# Patient Record
Sex: Male | Born: 1995 | Race: Black or African American | Hispanic: No | Marital: Single | Smoking: Never smoker
Health system: Southern US, Community
[De-identification: ages and names within clinical notes are randomized; demographics above are authoritative.]

## PROBLEM LIST (undated history)

## (undated) DIAGNOSIS — F419 Anxiety disorder, unspecified: Secondary | ICD-10-CM

## (undated) DIAGNOSIS — Z973 Presence of spectacles and contact lenses: Secondary | ICD-10-CM

## (undated) DIAGNOSIS — F909 Attention-deficit hyperactivity disorder, unspecified type: Secondary | ICD-10-CM

## (undated) DIAGNOSIS — T7840XA Allergy, unspecified, initial encounter: Secondary | ICD-10-CM

## (undated) DIAGNOSIS — F329 Major depressive disorder, single episode, unspecified: Secondary | ICD-10-CM

## (undated) DIAGNOSIS — F32A Depression, unspecified: Secondary | ICD-10-CM

## (undated) DIAGNOSIS — F845 Asperger's syndrome: Secondary | ICD-10-CM

## (undated) DIAGNOSIS — S36039A Unspecified laceration of spleen, initial encounter: Secondary | ICD-10-CM

## (undated) DIAGNOSIS — F332 Major depressive disorder, recurrent severe without psychotic features: Secondary | ICD-10-CM

## (undated) DIAGNOSIS — E669 Obesity, unspecified: Secondary | ICD-10-CM

## (undated) DIAGNOSIS — T1491XA Suicide attempt, initial encounter: Secondary | ICD-10-CM

## (undated) DIAGNOSIS — J329 Chronic sinusitis, unspecified: Secondary | ICD-10-CM

## (undated) HISTORY — DX: Obesity, unspecified: E66.9

## (undated) HISTORY — DX: Suicide attempt, initial encounter: T14.91XA

## (undated) HISTORY — DX: Allergy, unspecified, initial encounter: T78.40XA

## (undated) HISTORY — DX: Presence of spectacles and contact lenses: Z97.3

## (undated) HISTORY — PX: FRENULECTOMY, LINGUAL: SHX1681

## (undated) HISTORY — DX: Asperger's syndrome: F84.5

## (undated) HISTORY — DX: Unspecified laceration of spleen, initial encounter: S36.039A

---
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
## 2017-07-07 ENCOUNTER — Encounter: Payer: Self-pay | Admitting: Medical

## 2017-07-07 ENCOUNTER — Ambulatory Visit (INDEPENDENT_AMBULATORY_CARE_PROVIDER_SITE_OTHER): Payer: Medicaid Other | Admitting: Medical

## 2017-07-07 VITALS — BP 122/76 | HR 80 | Ht 72.0 in | Wt 256.2 lb

## 2017-07-07 DIAGNOSIS — Z0000 Encounter for general adult medical examination without abnormal findings: Secondary | ICD-10-CM | POA: Diagnosis not present

## 2017-07-07 DIAGNOSIS — Z23 Encounter for immunization: Secondary | ICD-10-CM

## 2017-07-07 DIAGNOSIS — F901 Attention-deficit hyperactivity disorder, predominantly hyperactive type: Secondary | ICD-10-CM | POA: Diagnosis not present

## 2017-07-07 DIAGNOSIS — Z79899 Other long term (current) drug therapy: Secondary | ICD-10-CM

## 2017-07-07 DIAGNOSIS — F840 Autistic disorder: Secondary | ICD-10-CM

## 2017-07-07 DIAGNOSIS — E669 Obesity, unspecified: Secondary | ICD-10-CM | POA: Diagnosis not present

## 2017-07-07 DIAGNOSIS — F332 Major depressive disorder, recurrent severe without psychotic features: Secondary | ICD-10-CM | POA: Diagnosis not present

## 2017-07-07 DIAGNOSIS — J301 Allergic rhinitis due to pollen: Secondary | ICD-10-CM

## 2017-07-07 NOTE — Progress Notes (Signed)
Subjective: Chief Complaint  Patient presents with  . Annual Exam    physical , no other concerns    Medical Team: Dr. Thedore MinsMojeed Akintayo, psychiatry New therapist, [facility]ton [facility]  Dr. Salvadore Farberanya Redd, dentist [facility] Tysinger, Kermit Baloavid S, PA-C here for primary care  Accompanied by mother most of the visit.  I did reserve time in the visit to speak with him alone but he had no private concerns today.  Most of the history today provided by mother and some by patient.  Randall Thompson is a 22 year old African-American male with significant mental health issues, prior suicidal attempt, major depression.  He has been with the last therapist for a year and a half, but that therapist just relocated so he is having to establish with a new therapist at this time.  He still sees Dr. Delories HeinzAckintayo every 2 months.  He currently lives at home with mother, has 2 sisters at home that he sometimes gets along with.  He reports today that all he does currently is sleep, has no interest in anything, no hobbies, not much interaction in the community.  Mom just recently had him set up with a job coach to explore some ideas.  1 of their goals is eventually getting him to the point of some independence.  He is receiving respite services through the [employer].  He was recently approved for [employer] transportation services.  Therapist recommends he get active. Recently got a dog that he can have responsibility for and walk the dog for exercise.   .    Reviewed their medical, surgical, family, social, medication, and allergy history and updated chart as appropriate.  Past Medical History:  Diagnosis Date  . ADHD (attention deficit hyperactivity disorder)   . Allergy   . Anxiety   . Asperger's disorder    evaluation by psychiatry and neurology 2002-2008 (teach program, therapy, psychiatry)  . Depression    sees a therapist and [facility]  . MDD (major depressive disorder),  recurrent severe, without psychosis (HCC) 03/03/2015  . Obesity   . Sinusitis   . Spleen laceration 2015  . Suicide attempt [facility](HCC)    several prior attempts as of 12/2015; hospitalization 10/2015 for suicide attempt  . Wears glasses     Past Surgical History:  Procedure Laterality Date  . FRENULECTOMY, LINGUAL      Social History   Socioeconomic History  . Marital status: Single    Spouse name: Not on file  . Number of children: Not on file  . Years of education: Not on file  . Highest education level: Not on file  Social Needs  . Financial resource strain: Not on file  . Food insecurity - worry: Not on file  . Food insecurity - inability: Not on file  . Transportation needs - medical: Not on file  . Transportation needs - non-medical: Not on file  Occupational History  . Not on file  Tobacco Use  . Smoking status: Never Smoker  . Smokeless tobacco: Never Used  Substance and Sexual Activity  . Alcohol use: No  . Drug use: No  . Sexual activity: No    Birth control/protection: Abstinence  Other Topics Concern  . Not on file  Social History Narrative   Lives at home with mother and 2 younger sisters, ages 7615 and 213.   [place] to [employer].  Plans to finish high school equivalents end of 2017.  Has 504 plan.  He states that he is not involved in any extra-curricular activities.  Sees father some.   There are no pets in the home and patient is not exposed to smoking.  Interested in computers, plays game on phone some.   Reads some.  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 Hypertension Father   . Depression Mother   . Fibromyalgia Mother   . Hypertension Mother   . ADD / ADHD Sister   . Depression Maternal Grandmother   . Hypertension Maternal Grandmother   . Hyperlipidemia Maternal Grandmother   . Heart disease Maternal Grandmother        long QT  . Hypertension Paternal  Grandmother   . Diabetes Paternal Grandmother   . Hypertension Paternal Grandfather   . Heart disease Paternal Grandfather      Current Outpatient Medications:  .  FLUoxetine (PROZAC) 40 MG capsule, Take 1 capsule (40 mg total) by mouth daily., Disp: 30 capsule, Rfl: 2 .  ibuprofen (ADVIL,MOTRIN) 200 MG tablet, Take 600 mg by mouth every 6 (six) hours as needed for headache or mild pain. Reported on 12/18/2015, Disp: , Rfl:  .  lurasidone (LATUDA) 40 MG TABS tablet, Take 1 tablet (40 mg total) by mouth daily with breakfast. (Patient taking differently: Take 40 mg by mouth at bedtime. ), Disp: 30 tablet, Rfl: 1 .  montelukast (SINGULAIR) 10 MG tablet, Take 1 tablet (10 mg total) by mouth at bedtime., Disp: 90 tablet, Rfl: 3 .  hydrOXYzine (ATARAX/VISTARIL) 25 MG tablet, TAKE 1 TWICE DAILY FOR ANXIETY/AGITATION, Disp: , Rfl: 1 .  zolpidem (AMBIEN) 10 MG tablet, Take 1 tablet (10 mg total) by mouth at bedtime as needed for sleep., Disp: 30 tablet, Rfl: 0  Allergies  Allergen Reactions  . Latex Swelling       Review of Systems Constitutional: -fever, -chills, -sweats, -unexpected weight change, -decreased appetite, -fatigue Allergy: -sneezing, -itching, -congestion Dermatology: -changing moles, --rash, -lumps ENT: -runny nose, -ear pain, -sore throat, -hoarseness, -sinus pain, -teeth pain, - ringing in ears, -hearing loss, -nosebleeds Cardiology: -chest pain, -palpitations, -swelling, -difficulty breathing when lying flat, -waking up short of breath Respiratory: -cough, -shortness of breath, -difficulty breathing with exercise or exertion, -wheezing, -coughing up blood Gastroenterology: -abdominal pain, -nausea, -vomiting, -diarrhea, -constipation, -blood in stool, -changes in bowel movement, -difficulty swallowing or eating Hematology: -bleeding, -bruising  Musculoskeletal: -joint aches, -muscle aches, -joint swelling, -back pain, -neck pain, -cramping, -changes in gait Ophthalmology:  denies vision changes, eye redness, itching, discharge Urology: -burning with urination, -difficulty urinating, -blood in urine, -urinary frequency, -urgency, -incontinence Neurology: -headache, -weakness, -tingling, -numbness, -memory loss, -falls, -dizziness Psychology: -depressed mood, -agitation, -sleep problems     Objective:   BP 122/76   Pulse 80   Ht 6' (1.829 m)   Wt 256 lb 3.2 oz (116.2 kg)   SpO2 98%   BMI 34.75 kg/m   General appearance: alert, no distress, WD/WN, African American male Skin: no worrisome lesions HEENT: normocephalic, conjunctiva/corneas normal, sclerae anicteric, PERRLA, EOMi, nares patent, no discharge or erythema, pharynx normal Oral cavity: MMM, tongue normal, teeth normal Neck: supple, no lymphadenopathy, no thyromegaly, no masses, normal ROM, no bruits Chest: non tender, normal shape and expansion Heart: RRR, normal S1, S2, no murmurs Lungs: CTA bilaterally, no wheezes, rhonchi, or rales Abdomen: +bs, soft, non tender, non distended, no masses, no hepatomegaly, no splenomegaly, no  bruits Back: non tender, normal ROM, no scoliosis Musculoskeletal: upper extremities non tender, no obvious deformity, normal ROM throughout, lower extremities non tender, no obvious deformity, normal ROM throughout Extremities: no edema, no cyanosis, no clubbing Pulses: 2+ symmetric, upper and lower extremities, normal cap refill Neurological: alert, oriented x 3, CN2-12 intact, strength normal upper extremities and lower extremities, sensation normal throughout, DTRs 2+ throughout, no cerebellar signs, gait normal Psychiatric: flat affect, pleasant , but no signifianct conversation or interaction ZO:[id number] today Rectal: deferred   Assessment and Plan :    Encounter Diagnoses  Name Primary?  . Encounter for health maintenance examination in adult Yes  . Need for influenza vaccination   . Autistic spectrum disorder   . Allergic rhinitis due to pollen, unspecified  seasonality   . High risk medication use   . MDD (major depressive disorder), recurrent severe, without psychosis (HCC)   . Obesity with serious comorbidity, unspecified classification, unspecified obesity type   . Attention deficit hyperactivity disorder (ADHD), predominantly hyperactive type     Physical exam - discussed and counseled on healthy lifestyle, diet, exercise, preventative care, vaccinations, sick and well care, proper use of emergency dept and after hours care, and addressed their concerns.  Routine labs today  See your eye doctor yearly for routine vision care. See your dentist yearly for routine dental care including hygiene visits twice yearly.  Vaccinations: Counseled on the influenza virus vaccine.  Vaccine information sheet given.  Influenza vaccine given after consent obtained.  Separate significant chronic issues discussed: We spent some time discussing his significant mental health issues.  I do not have access to his mental health records and I have not had conversations with his psychiatrist or counselor.  He does not seem to be in a position to likely hold a job in the normal setting, and not likely to be able to function independently.  I did recommend mother look into day programs, consider group home as a place of residence and community interaction in the future.  He certainly has had significant mental health issues including prior suicide attempt, and it would likely be very difficult for him to remain at home for years at his current status.   I will try and get mom a list of some of the resources in the community, and he will certainly follow-up with psychiatry and therapist.  Jerardo was seen today for annual exam.  Diagnoses and all orders for this visit:  Encounter for health maintenance examination in adult -     CBC -     Comprehensive metabolic panel -     Lipid panel -     TSH -     Hemoglobin A1c  Need for influenza vaccination -     Flu Vaccine  QUAD 6+ mos PF IM (Fluarix Quad PF)  Autistic spectrum disorder  Allergic rhinitis due to pollen, unspecified seasonality  High risk medication use  MDD (major depressive disorder), recurrent severe, without psychosis (HCC)  Obesity with serious comorbidity, unspecified classification, unspecified obesity type  Attention deficit hyperactivity disorder (ADHD), predominantly hyperactive type    Follow-up pending labs, yearly for physical

## 2017-07-08 ENCOUNTER — Encounter: Payer: Self-pay | Admitting: Medical

## 2017-07-08 LAB — HEMOGLOBIN A1C
Est. average glucose Bld gHb Est-mCnc: 94 mg/dL
Hgb A1c MFr Bld: 4.9 % (ref 4.8–5.6)

## 2017-07-08 LAB — CBC
HEMOGLOBIN: 13.5 g/dL (ref 13.0–17.7)
Hematocrit: 41.5 % (ref 37.5–51.0)
MCH: 23.3 pg — ABNORMAL LOW (ref 26.6–33.0)
MCHC: 32.5 g/dL (ref 31.5–35.7)
MCV: 72 fL — ABNORMAL LOW (ref 79–97)
Platelets: 309 10*3/uL (ref 150–379)
RBC: 5.8 x10E6/uL (ref 4.14–5.80)
RDW: 14.9 % (ref 12.3–15.4)
WBC: 5.7 10*3/uL (ref 3.4–10.8)

## 2017-07-08 LAB — LIPID PANEL
Chol/HDL Ratio: 3.7 ratio (ref 0.0–5.0)
Cholesterol, Total: 130 mg/dL (ref 100–169)
HDL: 35 mg/dL — AB (ref >39)
LDL CALC: 75 mg/dL (ref 0–109)
Triglycerides: 98 mg/dL — ABNORMAL HIGH (ref 0–89)
VLDL CHOLESTEROL CAL: 20 mg/dL (ref 5–40)

## 2017-07-08 LAB — COMPREHENSIVE METABOLIC PANEL
ALBUMIN: 4.6 g/dL (ref 3.5–5.5)
ALT: 26 IU/L (ref 0–44)
AST: 19 IU/L (ref 0–40)
Albumin/Globulin Ratio: 1.8 (ref 1.2–2.2)
Alkaline Phosphatase: 70 IU/L (ref 39–117)
BUN / CREAT RATIO: 12 (ref 9–20)
BUN: 14 mg/dL (ref 6–20)
Bilirubin Total: 0.5 mg/dL (ref 0.0–1.2)
CALCIUM: 9.3 mg/dL (ref 8.7–10.2)
CO2: 25 mmol/L (ref 20–29)
CREATININE: 1.16 mg/dL (ref 0.76–1.27)
Chloride: 102 mmol/L (ref 96–106)
GFR calc non Af Amer: 91 mL/min/{1.73_m2} (ref >59)
GFR, EST AFRICAN AMERICAN: 105 mL/min/{1.73_m2} (ref >59)
GLUCOSE: 104 mg/dL — AB (ref 65–99)
Globulin, Total: 2.6 g/dL (ref 1.5–4.5)
Potassium: 4.6 mmol/L (ref 3.5–5.2)
Sodium: 141 mmol/L (ref 134–144)
TOTAL PROTEIN: 7.2 g/dL (ref 6.0–8.5)

## 2017-07-08 LAB — TSH: TSH: 0.458 u[IU]/mL (ref 0.450–4.500)

## 2018-02-21 ENCOUNTER — Encounter: Payer: Self-pay | Admitting: Medical

## 2018-02-21 ENCOUNTER — Ambulatory Visit (INDEPENDENT_AMBULATORY_CARE_PROVIDER_SITE_OTHER): Payer: Medicaid Other | Admitting: Medical

## 2018-02-21 VITALS — BP 124/80 | HR 80 | Temp 98.4°F | Resp 16 | Ht 73.0 in | Wt 268.6 lb

## 2018-02-21 DIAGNOSIS — J988 Other specified respiratory disorders: Secondary | ICD-10-CM

## 2018-02-21 MED ORDER — AZITHROMYCIN 250 MG PO TABS: ORAL_TABLET | ORAL | 0 refills | Status: DC

## 2018-02-21 NOTE — Progress Notes (Signed)
Subjective: Chief Complaint  Patient presents with  . cold    cough, headache, congestion X 1 week   Here for headache, cough, congestion x 1 week.   No fever, no NVD.  Has had sore throat, but no ear pain.   Using Mucinex extra strength.   Good about water intake.  Has had sick contacts. No other aggravating or relieving factors. No other complaint.  Past Medical History:  Diagnosis Date  . ADHD (attention deficit hyperactivity disorder)   . Allergy   . Anxiety   . Asperger's disorder    evaluation by psychiatry and neurology 2002-2008 (teach program, therapy, psychiatry)  . Depression    sees a therapist and [facility]  . 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 Obesity   . Sinusitis   . Spleen laceration 2015  . Suicide attempt [facility](HCC)    several prior attempts as of 12/2015; hospitalization 10/2015 for suicide attempt  . Wears glasses    Current Outpatient Medications on File Prior to Visit  Medication Sig Dispense Refill  . FLUoxetine (PROZAC) 40 MG capsule Take 1 capsule (40 mg total) by mouth daily. 30 capsule 2  . hydrOXYzine (ATARAX/VISTARIL) 25 MG tablet TAKE 1 TWICE DAILY FOR ANXIETY/AGITATION  1  . ibuprofen (ADVIL,MOTRIN) 200 MG tablet Take 600 mg by mouth every 6 (six) hours as needed for headache or mild pain. Reported on 12/18/2015    . LATUDA 80 MG TABS tablet Take 80 mg by mouth 1 day or 1 dose.  2  . montelukast (SINGULAIR) 10 MG tablet Take 1 tablet (10 mg total) by mouth at bedtime. 90 tablet 3  . traZODone (DESYREL) 50 MG tablet Take 50 mg by mouth as needed.  2   No current facility-administered medications on file prior to visit.    ROS as in subjective   Objective: BP 124/80   Pulse 80   Temp 98.4 F (36.9 C) (Oral)   Resp 16   Ht 6\' 1"  (1.854 m)   Wt 268 lb 9.6 oz (121.8 kg)   SpO2 96%   BMI 35.44 kg/m    General appearance: alert, no distress, WD/WN,  HEENT:  normocephalic, sclerae anicteric, TMs pearly, nares with mild turbinate edema and mild mucoid discharge, mild erythema, pharynx normal Oral cavity: MMM, no lesions Neck: supple, no lymphadenopathy, no thyromegaly, no masses Heart: RRR, normal S1, S2, no murmurs Lungs: CTA bilaterally, no wheezes, rhonchi, or rales    Assessment Encounter Diagnosis  Name Primary?  Marland Kitchen. Respiratory tract infection Yes     Plan: symptoms and exam suggest viral URI.  Continue mucinex, needs to increase water intake, and gave watch and wait antibiotic in the even no improvement or worse in the next 48+ hours.  Currently exam unremarkable.  Call or return if worse or not improving.   Randall Thompson was seen today for cold.  Diagnoses and all orders for this visit:  Respiratory tract infection  Other orders -     azithromycin (ZITHROMAX) 250 MG tablet; 2 tablets day 1, then 1 tablet days 2-4

## 2019-01-26 ENCOUNTER — Other Ambulatory Visit: Payer: Self-pay

## 2019-01-26 ENCOUNTER — Encounter: Payer: Self-pay | Admitting: Medical

## 2019-01-26 ENCOUNTER — Ambulatory Visit: Payer: Medicaid Other | Admitting: Medical

## 2019-01-26 VITALS — BP 110/72 | HR 83 | Temp 97.1°F | Ht 73.0 in | Wt 287.4 lb

## 2019-01-26 DIAGNOSIS — Z79899 Other long term (current) drug therapy: Secondary | ICD-10-CM

## 2019-01-26 DIAGNOSIS — K036 Deposits [accretions] on teeth: Secondary | ICD-10-CM

## 2019-01-26 DIAGNOSIS — Z23 Encounter for immunization: Secondary | ICD-10-CM | POA: Diagnosis not present

## 2019-01-26 DIAGNOSIS — F840 Autistic disorder: Secondary | ICD-10-CM

## 2019-01-26 DIAGNOSIS — Z0000 Encounter for general adult medical examination without abnormal findings: Secondary | ICD-10-CM

## 2019-01-26 DIAGNOSIS — F332 Major depressive disorder, recurrent severe without psychotic features: Secondary | ICD-10-CM

## 2019-01-26 DIAGNOSIS — E669 Obesity, unspecified: Secondary | ICD-10-CM

## 2019-01-26 NOTE — Patient Instructions (Signed)
We updated your Tetanus diptheria and flu shot today  We will call with lab results  You have a lot of dental plaque today.   Brush your teeth twice daily.  Floss every day as well.  Go see the dentist soon for hygeine visit  See your eye doctor yearly.   I would try to limit video games to 1 hour at a time.   Get outside more, go for a walk or bike ride.   Read as much or more than you are on the video games   I recommend making some changes with eating habits and exercise as below.   Exercise: I recommend exercising most days of the week using a type of exercise that you would enjoy and stick to such as walking, running, swimming, hiking, biking, aerobics, etc. This needs to be at least 30-40 minutes at a time, at least 5 days/week with moderate intensity.    Low Carb Diet recommendations:  I recommend you drink water throughout the day.   70 ounces or 2 liters would be a good amount.  If you have been accustomed to drinking juice or soda, try water with lemon or water with lime, or try using no calorie flavor dropper.  I recommend the following as an example meal plan that includes 3 meals per day.   You can skip some meals periodically for intermittent fasting.  Breakfast (choose one): Marland Kitchen Omelette with egg, can include a little bit of cheese, your choice of mushrooms, peppers, onions, salsa . Smoothie with handful of spinach or kale, 1 cup of milk or water, 1 cup of berries, 1 packet of artificial sweetener such as stevia . Yogurt with fruit   Mid morning snack: . 1 fruit serving and 1 protein such as 8 almonds or 8 nuts, or vegetable such as carrots and humus or other similar vegetable   Lunch: . Salad with 3-4 ounces of lean grilled or baked meat such as fish, chicken or [place]   Mid afternoon snack: . 1 fruit serving and 1 protein such as 8 almonds or 8 nuts, or vegetable such as carrots and humus or other similar vegetable   Dinner: . Large serving of vegetables and  3-4 ounces of lean grilled or baked meat such as fish, chicken or [place] . Or vegetarian dish without meat    Avoid  . Chips, cookies, cake, donuts, soda, sweet tea, juices, candy, fast food . For now , avoid, or significantly limit grains

## 2019-01-26 NOTE — Progress Notes (Signed)
Subjective:   HPI  Randall Caohomas H Kapral Jr. is a 23 y.o. male who presents for Chief Complaint  Patient presents with  . Annual Exam    Medical team Sees dentist Sees eye doctor Dr. Thedore MinsMojeed Akintayo, psychiatry and Leone Payorrystal Montague, [facility] Dimitri Shakespeare, Kermit Baloavid S, PA-C here for primary care  Concerns: Here today unaccompanied in the room however mom did not get on the phone during part of the call.  He has a history of Asperger's, autism spectrum disorder, major depression disorder, other mental health issues.  Sees psychiatry monthly.  Recently had some medication changes was put on injection to reduce some of his pill burden.  He lives at home with mother.  He says he is searching for work (however I think this is what he told me last time I saw him about a year ago).  He spends he says half the day playing video games, spends sometimes reading.  He denies doing any exercise.  Overall he denies any recent problems  Mother says they continue to have problems with medications, problems with mood up and down  No other complaint  Reviewed their medical, surgical, family, social, medication, and allergy history and updated chart as appropriate.  Past Medical History:  Diagnosis Date  . ADHD (attention deficit hyperactivity disorder)   . Allergy   . Anxiety   . Asperger's disorder    evaluation by psychiatry and neurology 2002-2008 (teach program, therapy, psychiatry)  . Depression    sees a therapist and sees [facility] Luc[facility]umar[facility]  . 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 Obesity   . Sinusitis   . Spleen laceration 2015  . Suicide attempt [facility](HCC)    several prior attempts as of 12/2015; hospitalization 10/2015 for suicide attempt  . Wears glasses     Past Surgical History:  Procedure Laterality Date  . FRENULECTOMY, LINGUAL      Social History   Socioeconomic History  . Marital status:  Single    Spouse name: Not on file  . Number of children: Not on file  . Years of education: Not on file  . Highest education level: Not on file  Occupational History  . Not on file  Social Needs  . Financial resource strain: Not on file  . Food insecurity    Worry: Not on file    Inability: Not on file  . Transportation needs    Medical: Not on file    Non-medical: Not on file  Tobacco Use  . Smoking status: Never Smoker  . Smokeless tobacco: Never Used  Substance and Sexual Activity  . Alcohol use: No  . Drug use: No  . Sexual activity: Never    Birth control/protection: Abstinence  Lifestyle  . Physical activity    Days per week: Not on file    Minutes per session: Not on file  . Stress: Not on file  Relationships  . Social Musicianconnections    Talks on phone: Not on file    Gets together: Not on file    Attends religious service: Not on file    Active member of club or organization: Not on file    Attends meetings of clubs or organizations: Not on file    Relationship status: Not on file  . Intimate partner violence    Fear of current or ex partner: Not on file    Emotionally abused: Not on file    Physically abused:  Not on file    Forced sexual activity: Not on file  Other Topics Concern  . Not on file  Social History Narrative   Lives at home with mother.  Went to [employer] high school.  Spends a lot of time playing video games.  Sees father some.   01/2019    Family History  Problem Relation Age of Onset  . Hypertension Father   . Depression Mother   . Fibromyalgia Mother   . Hypertension Mother   . ADD / ADHD Sister   . Depression Maternal Grandmother   . Hypertension Maternal Grandmother   . Hyperlipidemia Maternal Grandmother   . Heart disease Maternal Grandmother        long QT  . Hypertension Paternal Grandmother   . Diabetes Paternal Grandmother   . Hypertension Paternal Grandfather   . Heart disease Paternal Grandfather      Current  Outpatient Medications:  .  FLUoxetine (PROZAC) 40 MG capsule, Take 1 capsule (40 mg total) by mouth daily., Disp: 30 capsule, Rfl: 2 .  ibuprofen (ADVIL,MOTRIN) 200 MG tablet, Take 600 mg by mouth every 6 (six) hours as needed for headache or mild pain. Reported on 12/18/2015, Disp: , Rfl:  .  montelukast (SINGULAIR) 10 MG tablet, Take 1 tablet (10 mg total) by mouth at bedtime., Disp: 90 tablet, Rfl: 3 .  traZODone (DESYREL) 50 MG tablet, Take 50 mg by mouth as needed., Disp: , Rfl: 2 .  azithromycin (ZITHROMAX) 250 MG tablet, 2 tablets day 1, then 1 tablet days 2-4 (Patient not taking: Reported on 01/26/2019), Disp: 6 tablet, Rfl: 0 .  hydrOXYzine (ATARAX/VISTARIL) 25 MG tablet, TAKE 1 TWICE DAILY FOR ANXIETY/AGITATION, Disp: , Rfl: 1 .  LATUDA 80 MG TABS tablet, Take 80 mg by mouth 1 day or 1 dose., Disp: , Rfl: 2  Allergies  Allergen Reactions  . Latex Swelling    Review of Systems Constitutional: -fever, -chills, -sweats, -unexpected weight change, -decreased appetite, -fatigue Allergy: -sneezing, -itching, -congestion Dermatology: -changing moles, --rash, -lumps ENT: -runny nose, -ear pain, -sore throat, -hoarseness, -sinus pain, -teeth pain, - ringing in ears, -hearing loss, -nosebleeds Cardiology: -chest pain, -palpitations, -swelling, -difficulty breathing when lying flat, -waking up short of breath Respiratory: -cough, -shortness of breath, -difficulty breathing with exercise or exertion, -wheezing, -coughing up blood Gastroenterology: -abdominal pain, -nausea, -vomiting, -diarrhea, -constipation, -blood in stool, -changes in bowel movement, -difficulty swallowing or eating Hematology: -bleeding, -bruising  Musculoskeletal: -joint aches, -muscle aches, -joint swelling, -back pain, -neck pain, -cramping, -changes in gait Ophthalmology: denies vision changes, eye redness, itching, discharge Urology: -burning with urination, -difficulty urinating, -blood in urine, -urinary frequency,  -urgency, -incontinence Neurology: -headache, -weakness, -tingling, -numbness, -memory loss, -falls, -dizziness Psychology: +depressed mood, -agitation, -sleep problems Male GU: no testicular mass, pain, no lymph nodes swollen, no swelling, no rash.     Objective:  BP 110/72 (BP Location: Left Arm, Patient Position: Sitting)   Pulse 83   Temp (!) 97.1 F (36.2 C)   Ht 6\' 1"  (1.854 m)   Wt 287 lb 6.4 oz (130.4 kg)   SpO2 98%   BMI 37.92 kg/m   General appearance: alert, no distress, WD/WN, African American male Skin: no worrisome lesions HEENT: normocephalic, conjunctiva/corneas normal, sclerae anicteric, PERRLA, EOMi, nares patent, no discharge or erythema, pharynx normal Oral cavity: MMM, tongue normal, moderate plaque Neck: supple, no lymphadenopathy, no thyromegaly, no masses, normal ROM, no bruits Chest: non tender, normal shape and expansion Heart: RRR, normal S1,  S2, no murmurs Lungs: CTA bilaterally, no wheezes, rhonchi, or rales Abdomen: +bs, soft, non tender, non distended, no masses, no hepatomegaly, no splenomegaly, no bruits Back: non tender, normal ROM, no scoliosis Musculoskeletal: upper extremities non tender, no obvious deformity, normal ROM throughout, lower extremities non tender, no obvious deformity, normal ROM throughout Extremities: no edema, no cyanosis, no clubbing Pulses: 2+ symmetric, upper and lower extremities, normal cap refill Neurological: alert, oriented x 3, CN2-12 intact, strength normal upper extremities and lower extremities, sensation normal throughout, DTRs 2+ throughout, no cerebellar signs, gait normal Psychiatric: normal affect, behavior normal, pleasant  GU/rectal - deferred   Assessment and Plan :   Encounter Diagnoses  Name Primary?  . Encounter for health maintenance examination in adult Yes  . Need for influenza vaccination   . Need for Td vaccine   . High risk medication use   . Obesity with serious comorbidity, unspecified  classification, unspecified obesity type   . Dental plaque   . Autistic spectrum disorder   . MDD (major depressive disorder), recurrent severe, without psychosis (HCC)     Physical exam - discussed and counseled on healthy lifestyle, diet, exercise, preventative care, vaccinations, sick and well care, proper use of emergency dept and after hours care, and addressed their concerns.    Health screening: See your eye doctor yearly for routine vision care. See your dentist yearly for routine dental care including hygiene visits twice yearly.  Cancer screening Advised monthly self testicular exam  Vaccinations: Counseled on the influenza virus vaccine.  Vaccine information sheet given.  Influenza vaccine given after consent obtained.  Counseled on the Td (tetanus, diptheria) vaccine.  Vaccine information sheet given. Td vaccine given after consent obtained.  Separate significant chronic issues discussed: We spent some considerable time talking about diet, exercise, need to lose weight, need to spend less time on video games, and more time with reading and getting some exercise and outside time  Dental plaque-discussed importance of brushing daily, flossing daily and needs to see dental hygienist soon  Mental health issues, autism spectrum disorder, depression, history of suicide attempt-followed by psychiatry regularly  Randall Thompson was seen today for annual exam.  Diagnoses and all orders for this visit:  Encounter for health maintenance examination in adult -     Comprehensive metabolic panel -     CBC with Differential/Platelet -     Lipid panel -     Hemoglobin A1c  Need for influenza vaccination -     Flu Vaccine QUAD 36+ mos IM  Need for Td vaccine -     Td : Tetanus/diphtheria >7yo Preservative  free  High risk medication use  Obesity with serious comorbidity, unspecified classification, unspecified obesity type  Dental plaque  Autistic spectrum disorder  MDD (major  depressive disorder), recurrent severe, without psychosis (HCC)    Follow-up pending labs, yearly for physical

## 2019-01-27 LAB — CBC WITH DIFFERENTIAL/PLATELET
Basophils Absolute: 0 10*3/uL (ref 0.0–0.2)
Basos: 0 %
EOS (ABSOLUTE): 0.1 10*3/uL (ref 0.0–0.4)
Eos: 2 %
Hematocrit: 39.3 % (ref 37.5–51.0)
Hemoglobin: 12.5 g/dL — ABNORMAL LOW (ref 13.0–17.7)
Immature Grans (Abs): 0 10*3/uL (ref 0.0–0.1)
Immature Granulocytes: 0 %
Lymphocytes Absolute: 1.7 10*3/uL (ref 0.7–3.1)
Lymphs: 26 %
MCH: 23.6 pg — ABNORMAL LOW (ref 26.6–33.0)
MCHC: 31.8 g/dL (ref 31.5–35.7)
MCV: 74 fL — ABNORMAL LOW (ref 79–97)
Monocytes Absolute: 0.4 10*3/uL (ref 0.1–0.9)
Monocytes: 6 %
Neutrophils Absolute: 4.4 10*3/uL (ref 1.4–7.0)
Neutrophils: 66 %
Platelets: 285 10*3/uL (ref 150–450)
RBC: 5.3 x10E6/uL (ref 4.14–5.80)
RDW: 14.9 % (ref 11.6–15.4)
WBC: 6.6 10*3/uL (ref 3.4–10.8)

## 2019-01-27 LAB — COMPREHENSIVE METABOLIC PANEL
ALT: 28 IU/L (ref 0–44)
AST: 21 IU/L (ref 0–40)
Albumin/Globulin Ratio: 1.7 (ref 1.2–2.2)
Albumin: 4.6 g/dL (ref 4.1–5.2)
Alkaline Phosphatase: 61 IU/L (ref 39–117)
BUN/Creatinine Ratio: 13 (ref 9–20)
BUN: 15 mg/dL (ref 6–20)
Bilirubin Total: 0.7 mg/dL (ref 0.0–1.2)
CO2: 23 mmol/L (ref 20–29)
Calcium: 9.7 mg/dL (ref 8.7–10.2)
Chloride: 100 mmol/L (ref 96–106)
Creatinine, Ser: 1.18 mg/dL (ref 0.76–1.27)
GFR calc Af Amer: 101 mL/min/{1.73_m2} (ref >59)
GFR calc non Af Amer: 88 mL/min/{1.73_m2} (ref >59)
Globulin, Total: 2.7 g/dL (ref 1.5–4.5)
Glucose: 78 mg/dL (ref 65–99)
Potassium: 4.6 mmol/L (ref 3.5–5.2)
Sodium: 139 mmol/L (ref 134–144)
Total Protein: 7.3 g/dL (ref 6.0–8.5)

## 2019-01-27 LAB — LIPID PANEL
Chol/HDL Ratio: 3.2 ratio (ref 0.0–5.0)
Cholesterol, Total: 134 mg/dL (ref 100–199)
HDL: 42 mg/dL (ref >39)
LDL Calculated: 76 mg/dL (ref 0–99)
Triglycerides: 79 mg/dL (ref 0–149)
VLDL Cholesterol Cal: 16 mg/dL (ref 5–40)

## 2019-01-27 LAB — HEMOGLOBIN A1C
Est. average glucose Bld gHb Est-mCnc: 97 mg/dL
Hgb A1c MFr Bld: 5 % (ref 4.8–5.6)

## 2019-08-18 ENCOUNTER — Ambulatory Visit: Payer: Medicaid Other | Attending: Internal Medicine

## 2019-08-18 DIAGNOSIS — Z23 Encounter for immunization: Secondary | ICD-10-CM

## 2019-08-18 NOTE — Progress Notes (Signed)
   [id number]  Name:  Coyle Stordahl.    MRN: [id number] DOB: 01/25/1996  08/18/2019  Mr. Capaldi was observed post Covid-19 immunization for 15 minutes without incident. He was provided with Vaccine Information Sheet and instruction to access the V-Safe system.   Mr. Lamere was instructed to call 911 with any severe reactions post vaccine: Marland Kitchen Difficulty breathing  . Swelling of face and throat  . A fast heartbeat  . A bad rash all over body  . Dizziness and weakness   Immunizations Administered    Name Date Dose VIS Date Route   Pfizer COVID-19 Vaccine 08/18/2019  8:27 AM 0.3 mL 05/19/2019 Intramuscular   Manufacturer: [employer], [employer]   Lot: [id number]   NDC: [address]-6

## 2019-09-12 ENCOUNTER — Ambulatory Visit: Payer: Medicaid Other | Attending: Internal Medicine

## 2019-09-12 DIAGNOSIS — Z23 Encounter for immunization: Secondary | ICD-10-CM

## 2019-09-12 NOTE — Progress Notes (Signed)
   [id number]  Name:  Randall Thompson.    MRN: [id number] DOB: 1996/03/02  09/12/2019  [name] was observed post Covid-19 immunization for 15 minutes without incident. He was provided with Vaccine Information Sheet and instruction to access the V-Safe system.   [name] was instructed to call 911 with any severe reactions post vaccine: Marland Kitchen Difficulty breathing  . Swelling of face and throat  . A fast heartbeat  . A bad rash all over body  . Dizziness and weakness   Immunizations Administered    Name Date Dose VIS Date Route   Pfizer COVID-19 Vaccine 09/12/2019  2:49 PM 0.3 mL 05/19/2019 Intramuscular   Manufacturer: [employer], [employer]   Lot: [id number]   NDC: [address]-9

## 2020-01-26 ENCOUNTER — Other Ambulatory Visit: Payer: Medicaid Other

## 2020-02-02 ENCOUNTER — Ambulatory Visit: Payer: Medicaid Other | Admitting: Medical

## 2020-02-02 ENCOUNTER — Encounter: Payer: Self-pay | Admitting: Medical

## 2020-02-02 ENCOUNTER — Other Ambulatory Visit: Payer: Self-pay

## 2020-02-02 VITALS — BP 112/80 | HR 89 | Ht 73.0 in | Wt 298.8 lb

## 2020-02-02 DIAGNOSIS — Z79899 Other long term (current) drug therapy: Secondary | ICD-10-CM | POA: Diagnosis not present

## 2020-02-02 DIAGNOSIS — Z131 Encounter for screening for diabetes mellitus: Secondary | ICD-10-CM | POA: Insufficient documentation

## 2020-02-02 DIAGNOSIS — F901 Attention-deficit hyperactivity disorder, predominantly hyperactive type: Secondary | ICD-10-CM | POA: Diagnosis not present

## 2020-02-02 DIAGNOSIS — Z23 Encounter for immunization: Secondary | ICD-10-CM | POA: Diagnosis not present

## 2020-02-02 DIAGNOSIS — Z13220 Encounter for screening for lipoid disorders: Secondary | ICD-10-CM

## 2020-02-02 DIAGNOSIS — F332 Major depressive disorder, recurrent severe without psychotic features: Secondary | ICD-10-CM | POA: Diagnosis not present

## 2020-02-02 DIAGNOSIS — F840 Autistic disorder: Secondary | ICD-10-CM

## 2020-02-02 DIAGNOSIS — Z0000 Encounter for general adult medical examination without abnormal findings: Secondary | ICD-10-CM | POA: Diagnosis not present

## 2020-02-02 NOTE — Addendum Note (Signed)
Addended by: Victorio Palm on: 02/02/2020 11:28 AM   Modules accepted: Orders

## 2020-02-02 NOTE — Progress Notes (Signed)
Subjective:   HPI  Randall Thompson. is a 24 y.o. male who presents for Chief Complaint  Patient presents with  . Annual Exam    with fasting labs     Medical team Sees dentist Sees eye doctor Dr. Thedore Mins, psychiatry and Leone Payor, NP at [facility] Mikyla Schachter, Kermit Balo, PA-C here for primary care  Concerns: Doing well.   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  Lives with mom and 2 sisters.  One sister just went to college.  Younger sister is 17yo.   Sees psychiatry monthly  Gets some exercise with walking.     No other complaint  Reviewed their medical, surgical, family, social, medication, and allergy history and updated chart as appropriate.  Past Medical History:  Diagnosis Date  . ADHD (attention deficit hyperactivity disorder)   . Allergy   . Anxiety   . Asperger's disorder    evaluation by psychiatry and neurology 2002-2008 (teach program, therapy, psychiatry)  . Depression    sees a therapist and sees Dr. Lucianne Muss, [facility]  . 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 Obesity   . Sinusitis   . Spleen laceration 2015  . Suicide attempt [facility])    several prior attempts as of 12/2015; hospitalization 10/2015 for suicide attempt  . Wears glasses     Past Surgical History:  Procedure Laterality Date  . FRENULECTOMY, LINGUAL      Social History   Socioeconomic History  . Marital status: Single    Spouse name: Not on file  . Number of children: Not on file  . Years of education: Not on file  . Highest education level: Not on file  Occupational History  . Not on file  Tobacco Use  . Smoking status: Never Smoker  . Smokeless tobacco: Never Used  Vaping Use  . Vaping Use: Never used  Substance and Sexual Activity  . Alcohol use: No  . Drug use: No  . Sexual activity: Never    Birth control/protection:  Abstinence  Other Topics Concern  . Not on file  Social History Narrative   Lives at home with mother.  Went to [employer] high school.  Spends a lot of time playing video games.  Working at [employer] since 04/2019.  Sees father some.   01/2020   Social Determinants of Health   Financial Resource Strain:   . Difficulty of Paying Living Expenses: Not on file  Food Insecurity:   . Worried About Programme researcher, broadcasting/film/video in the Last Year: Not on file  . Ran Out of Food in the Last Year: Not on file  Transportation Needs:   . Lack of Transportation (Medical): Not on file  . Lack of Transportation (Non-Medical): Not on file  Physical Activity:   . Days of Exercise per Week: Not on file  . Minutes of Exercise per Session: Not on file  Stress:   . Feeling of Stress : Not on file  Social Connections:   . Frequency of Communication with Friends and Family: Not on file  . Frequency of Social Gatherings with Friends and Family: Not on file  . Attends Religious Services: Not on file  . Active Member of Clubs or Organizations: Not on file  . Attends Banker Meetings: Not on file  . Marital Status: Not on file  Intimate Partner Violence:   .  Fear of Current or Ex-Partner: Not on file  . Emotionally Abused: Not on file  . Physically Abused: Not on file  . Sexually Abused: Not on file    Family History  Problem Relation Age of Onset  . Hypertension Father   . Depression Mother   . Fibromyalgia Mother   . Hypertension Mother   . ADD / ADHD Sister   . Depression Maternal Grandmother   . Hypertension Maternal Grandmother   . Hyperlipidemia Maternal Grandmother   . Heart disease Maternal Grandmother        long QT  . Hypertension Paternal Grandmother   . Diabetes Paternal Grandmother   . Hypertension Paternal Grandfather   . Heart disease Paternal Grandfather      Current Outpatient Medications:  .  INVEGA SUSTENNA 156 MG/ML SUSY injection, SMARTSIG:156 Milliliter(s)  IM Every 4 Weeks, Disp: , Rfl:  .  azithromycin (ZITHROMAX) 250 MG tablet, 2 tablets day 1, then 1 tablet days 2-4 (Patient not taking: Reported on 01/26/2019), Disp: 6 tablet, Rfl: 0 .  FLUoxetine (PROZAC) 40 MG capsule, Take 1 capsule (40 mg total) by mouth daily. (Patient not taking: Reported on 02/02/2020), Disp: 30 capsule, Rfl: 2 .  hydrOXYzine (ATARAX/VISTARIL) 25 MG tablet, TAKE 1 TWICE DAILY FOR ANXIETY/AGITATION (Patient not taking: Reported on 02/02/2020), Disp: , Rfl: 1 .  ibuprofen (ADVIL,MOTRIN) 200 MG tablet, Take 600 mg by mouth every 6 (six) hours as needed for headache or mild pain. Reported on 12/18/2015 (Patient not taking: Reported on 02/02/2020), Disp: , Rfl:  .  LATUDA 80 MG TABS tablet, Take 80 mg by mouth 1 day or 1 dose. (Patient not taking: Reported on 02/02/2020), Disp: , Rfl: 2 .  montelukast (SINGULAIR) 10 MG tablet, Take 1 tablet (10 mg total) by mouth at bedtime. (Patient not taking: Reported on 02/02/2020), Disp: 90 tablet, Rfl: 3 .  traZODone (DESYREL) 50 MG tablet, Take 50 mg by mouth as needed. (Patient not taking: Reported on 02/02/2020), Disp: , Rfl: 2  Allergies  Allergen Reactions  . Latex Swelling    Review of Systems Constitutional: -fever, -chills, -sweats, -unexpected weight change, -decreased appetite, -fatigue Allergy: -sneezing, -itching, -congestion Dermatology: -changing moles, --rash, -lumps ENT: -runny nose, -ear pain, -sore throat, -hoarseness, -sinus pain, -teeth pain, - ringing in ears, -hearing loss, -nosebleeds Cardiology: -chest pain, -palpitations, -swelling, -difficulty breathing when lying flat, -waking up short of breath Respiratory: -cough, -shortness of breath, -difficulty breathing with exercise or exertion, -wheezing, -coughing up blood Gastroenterology: -abdominal pain, -nausea, -vomiting, -diarrhea, -constipation, -blood in stool, -changes in bowel movement, -difficulty swallowing or eating Hematology: -bleeding, -bruising   Musculoskeletal: -joint aches, -muscle aches, -joint swelling, -back pain, -neck pain, -cramping, -changes in gait Ophthalmology: denies vision changes, eye redness, itching, discharge Urology: -burning with urination, -difficulty urinating, -blood in urine, -urinary frequency, -urgency, -incontinence Neurology: -headache, -weakness, -tingling, -numbness, -memory loss, -falls, -dizziness Psychology: -depressed mood, -agitation, -sleep problems Male GU: no testicular mass, pain, no lymph nodes swollen, no swelling, no rash.     Objective:  BP 112/80   Pulse 89   Ht 6\' 1"  (1.854 m)   Wt 298 lb 12.8 oz (135.5 kg)   SpO2 96%   BMI 39.42 kg/m   Wt Readings from Last 3 Encounters:  02/02/20 298 lb 12.8 oz (135.5 kg)  01/26/19 287 lb 6.4 oz (130.4 kg)  02/21/18 268 lb 9.6 oz (121.8 kg)    General appearance: alert, no distress, WD/WN, African American male Skin: no worrisome lesions,  linear scar and some round small scars left anterior lower leg from prior trauma Neck: supple, no lymphadenopathy, no thyromegaly, no masses, normal ROM, no bruits Chest: non tender, normal shape and expansion Heart: RRR, normal S1, S2, no murmurs Lungs: CTA bilaterally, no wheezes, rhonchi, or rales Abdomen: +bs, soft, non tender, non distended, no masses, no hepatomegaly, no splenomegaly, no bruits Back: non tender, normal ROM, no scoliosis Musculoskeletal: upper extremities non tender, no obvious deformity, normal ROM throughout, lower extremities non tender, no obvious deformity, normal ROM throughout Extremities: no edema, no cyanosis, no clubbing Pulses: 2+ symmetric, upper and lower extremities, normal cap refill Neurological: alert, oriented x 3, CN2-12 intact, strength normal upper extremities and lower extremities, sensation normal throughout, DTRs 2+ throughout, no cerebellar signs, gait normal Psychiatric: normal affect, behavior normal, pleasant  GU/rectal  - normal male, circ, no mass, no  hernia   Assessment and Plan :   Encounter Diagnoses  Name Primary?  . Encounter for health maintenance examination in adult Yes  . MDD (major depressive disorder), recurrent severe, without psychosis (HCC)   . Attention deficit hyperactivity disorder (ADHD), predominantly hyperactive type   . High risk medication use   . Autistic spectrum disorder   . Screening for lipid disorders   . Screening for diabetes mellitus   . Need for influenza vaccination     Physical exam - discussed and counseled on healthy lifestyle, diet, exercise, preventative care, vaccinations, sick and well care, proper use of emergency dept and after hours care, and addressed their concerns.    Health screening: See your eye doctor yearly for routine vision care. See your dentist yearly for routine dental care including hygiene visits twice yearly.  Cancer screening Advised monthly self testicular exam  Vaccinations: Counseled on the influenza virus vaccine.  Vaccine information sheet given.  Influenza vaccine given after consent obtained.   Separate significant chronic issues discussed: Glad to hear he has a job and doing well  Mental health issues, autism spectrum disorder, depression, history of suicide attempt-followed by psychiatry regularly   Assad was seen today for annual exam.  Diagnoses and all orders for this visit:  Encounter for health maintenance examination in adult -     Comprehensive metabolic panel -     CBC with Differential/Platelet -     Lipid panel -     Hemoglobin A1c -     TSH  MDD (major depressive disorder), recurrent severe, without psychosis (HCC)  Attention deficit hyperactivity disorder (ADHD), predominantly hyperactive type  High risk medication use  Autistic spectrum disorder  Screening for lipid disorders -     Lipid panel  Screening for diabetes mellitus -     Hemoglobin A1c  Need for influenza vaccination    Follow-up pending labs, yearly for  physical

## 2020-02-03 LAB — COMPREHENSIVE METABOLIC PANEL
ALT: 41 IU/L (ref 0–44)
AST: 25 IU/L (ref 0–40)
Albumin/Globulin Ratio: 1.4 (ref 1.2–2.2)
Albumin: 4.6 g/dL (ref 4.1–5.2)
Alkaline Phosphatase: 77 IU/L (ref 48–121)
BUN/Creatinine Ratio: 15 (ref 9–20)
BUN: 14 mg/dL (ref 6–20)
Bilirubin Total: 0.5 mg/dL (ref 0.0–1.2)
CO2: 25 mmol/L (ref 20–29)
Calcium: 9.7 mg/dL (ref 8.7–10.2)
Chloride: 100 mmol/L (ref 96–106)
Creatinine, Ser: 0.95 mg/dL (ref 0.76–1.27)
GFR calc Af Amer: 131 mL/min/{1.73_m2} (ref >59)
GFR calc non Af Amer: 113 mL/min/{1.73_m2} (ref >59)
Globulin, Total: 3.3 g/dL (ref 1.5–4.5)
Glucose: 99 mg/dL (ref 65–99)
Potassium: 4.6 mmol/L (ref 3.5–5.2)
Sodium: 137 mmol/L (ref 134–144)
Total Protein: 7.9 g/dL (ref 6.0–8.5)

## 2020-02-03 LAB — CBC WITH DIFFERENTIAL/PLATELET
Basophils Absolute: 0 10*3/uL (ref 0.0–0.2)
Basos: 0 %
EOS (ABSOLUTE): 0.1 10*3/uL (ref 0.0–0.4)
Eos: 2 %
Hematocrit: 41.2 % (ref 37.5–51.0)
Hemoglobin: 13.3 g/dL (ref 13.0–17.7)
Immature Grans (Abs): 0 10*3/uL (ref 0.0–0.1)
Immature Granulocytes: 0 %
Lymphocytes Absolute: 1.9 10*3/uL (ref 0.7–3.1)
Lymphs: 27 %
MCH: 23.9 pg — ABNORMAL LOW (ref 26.6–33.0)
MCHC: 32.3 g/dL (ref 31.5–35.7)
MCV: 74 fL — ABNORMAL LOW (ref 79–97)
Monocytes Absolute: 0.4 10*3/uL (ref 0.1–0.9)
Monocytes: 6 %
Neutrophils Absolute: 4.6 10*3/uL (ref 1.4–7.0)
Neutrophils: 65 %
Platelets: 306 10*3/uL (ref 150–450)
RBC: 5.56 x10E6/uL (ref 4.14–5.80)
RDW: 14.5 % (ref 11.6–15.4)
WBC: 7.1 10*3/uL (ref 3.4–10.8)

## 2020-02-03 LAB — TSH: TSH: 0.601 u[IU]/mL (ref 0.450–4.500)

## 2020-02-03 LAB — LIPID PANEL
Chol/HDL Ratio: 3.7 ratio (ref 0.0–5.0)
Cholesterol, Total: 143 mg/dL (ref 100–199)
HDL: 39 mg/dL — ABNORMAL LOW (ref >39)
LDL Chol Calc (NIH): 83 mg/dL (ref 0–99)
Triglycerides: 112 mg/dL (ref 0–149)
VLDL Cholesterol Cal: 21 mg/dL (ref 5–40)

## 2020-02-03 LAB — HEMOGLOBIN A1C
Est. average glucose Bld gHb Est-mCnc: 91 mg/dL
Hgb A1c MFr Bld: 4.8 % (ref 4.8–5.6)

## 2020-04-02 ENCOUNTER — Ambulatory Visit: Payer: Medicaid Other

## 2022-02-11 ENCOUNTER — Encounter: Payer: Self-pay | Admitting: Internal Medicine

## 2022-03-17 ENCOUNTER — Encounter: Payer: Self-pay | Admitting: Internal Medicine

## 2022-07-08 NOTE — Telephone Encounter (Signed)
Pt coming in on Friday to discuss forms

## 2022-07-08 NOTE — Telephone Encounter (Signed)
I received forms to complete but have not seen this patient in 3 years.  Either needs an updated appointment or find out what the situation is

## 2022-07-10 ENCOUNTER — Ambulatory Visit (INDEPENDENT_AMBULATORY_CARE_PROVIDER_SITE_OTHER): Payer: Medicaid Other | Admitting: Medical

## 2022-07-10 ENCOUNTER — Encounter: Payer: Self-pay | Admitting: Medical

## 2022-07-10 VITALS — BP 104/64 | HR 82 | Ht 73.0 in | Wt 293.4 lb

## 2022-07-10 DIAGNOSIS — F901 Attention-deficit hyperactivity disorder, predominantly hyperactive type: Secondary | ICD-10-CM | POA: Diagnosis not present

## 2022-07-10 DIAGNOSIS — Z79899 Other long term (current) drug therapy: Secondary | ICD-10-CM

## 2022-07-10 DIAGNOSIS — F840 Autistic disorder: Secondary | ICD-10-CM | POA: Diagnosis not present

## 2022-07-10 DIAGNOSIS — F32A Depression, unspecified: Secondary | ICD-10-CM | POA: Diagnosis not present

## 2022-07-10 DIAGNOSIS — F913 Oppositional defiant disorder: Secondary | ICD-10-CM

## 2022-07-10 LAB — CBC
MCH: 23.4 pg — ABNORMAL LOW (ref 26.6–33.0)
MCHC: 32 g/dL (ref 31.5–35.7)
MCV: 73 fL — ABNORMAL LOW (ref 79–97)
RBC: 5.68 x10E6/uL (ref 4.14–5.80)
WBC: 3.9 10*3/uL (ref 3.4–10.8)

## 2022-07-10 LAB — COMPREHENSIVE METABOLIC PANEL

## 2022-07-10 LAB — TSH

## 2022-07-10 NOTE — Progress Notes (Signed)
Subjective: Chief Complaint  Patient presents with   Form Completion    Form completion for [facility] program- for job Trying to find a new pcp in [place] where pt now lives   Here for f/u.   Living in [place], [place].  Lives with mother and her husband.   Here for a form completion for autism teach to get assistance with some job skills.  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  Been working at [employer] x 3 years.  Not in school.   Primarily seeing Dr. Tonette Bihari, neuropsychic currently every 3 months  Not seeing other doctors currently.  Lately feeling healthy.  Exercise - none.  In free time helps mother or plays video games.  He communicates with his coworkers, Librarian, academic, and immediate family.  He tends to be more private and to himself other times.  He does not do a lot of exercise.  Past Medical History:  Diagnosis Date   ADHD (attention deficit hyperactivity disorder)    Allergy    Anxiety    Asperger's disorder    evaluation by psychiatry and neurology 2002-2008 (teach program, therapy, psychiatry)   Depression    sees a therapist and sees Dr. Dwyane Dee, [facility]   MDD (major depressive disorder), recurrent severe, without psychosis ([place]) 03/03/2015   Obesity    Sinusitis    Spleen laceration 2015   Suicide attempt ([place])    several prior attempts as of 12/2015; hospitalization 10/2015 for suicide attempt   Wears glasses    Current Outpatient Medications on File Prior to Visit  Medication Sig Dispense Refill   hydrOXYzine (ATARAX/VISTARIL) 25 MG tablet TAKE 1 TWICE DAILY FOR ANXIETY/AGITATION  1   paliperidone (INVEGA) 9 MG 24 hr tablet Take 9 mg by mouth daily.     ibuprofen (ADVIL,MOTRIN) 200 MG tablet Take 600 mg by mouth every 6 (six) hours as needed for headache or mild pain. Reported on 12/18/2015 (Patient not taking: Reported on 02/02/2020)     traZODone (DESYREL) 50 MG tablet Take 50 mg by mouth as needed.  2    No current facility-administered medications on file prior to visit.   ROS as in subjective    Objective: BP 104/64   Pulse 82   Ht 6\' 1"  (1.854 m)   Wt 293 lb 6.4 oz (133.1 kg)   BMI 38.71 kg/m   General appearence: alert, no distress, WD/WN,  Neck: supple, no lymphadenopathy, no thyromegaly, no masses Heart: RRR, normal S1, S2, no murmurs Lungs: CTA bilaterally, no wheezes, rhonchi, or rales Pulses: 2+ symmetric, upper and lower extremities, normal cap refill Psych: Pleasant, answers questions appropriately    Assessment: Encounter Diagnoses  Name Primary?   Depressive disorder Yes   Autistic spectrum disorder    Attention deficit hyperactivity disorder (ADHD), predominantly hyperactive type    High risk medication use    Oppositional defiant disorder      Plan: This is the first visit I have seen him since 2021.  He is here alone today.  He was more communicative and conversive today than prior visits.  Glad to hear he has been working part-time and holding a job.  He still lives with his mother and tends to be more private and social.  I completed his autism teach form today.  We will fax this form, and hopefully he can get some additional services to help with job skills.  Baseline labs today since I have not  seen him in a few years   Mcgwire was seen today for form completion.  Diagnoses and all orders for this visit:  Depressive disorder -     Comprehensive metabolic panel -     CBC -     TSH  Autistic spectrum disorder  Attention deficit hyperactivity disorder (ADHD), predominantly hyperactive type  High risk medication use -     Comprehensive metabolic panel -     CBC -     TSH  Oppositional defiant disorder    F/u pending labs

## 2022-07-11 LAB — COMPREHENSIVE METABOLIC PANEL
AST: 24 IU/L (ref 0–40)
Alkaline Phosphatase: 70 IU/L (ref 44–121)
Bilirubin Total: 0.5 mg/dL (ref 0.0–1.2)
CO2: 22 mmol/L (ref 20–29)
Creatinine, Ser: 1.13 mg/dL (ref 0.76–1.27)
Globulin, Total: 2.6 g/dL (ref 1.5–4.5)
Potassium: 4.5 mmol/L (ref 3.5–5.2)
Sodium: 137 mmol/L (ref 134–144)
Total Protein: 7.2 g/dL (ref 6.0–8.5)

## 2022-07-11 LAB — CBC
Hematocrit: 41.6 % (ref 37.5–51.0)
Hemoglobin: 13.3 g/dL (ref 13.0–17.7)
Platelets: 283 10*3/uL (ref 150–450)
RDW: 14.5 % (ref 11.6–15.4)

## 2022-07-12 NOTE — Progress Notes (Signed)
Liver, kidney, glucose and electrolytes normal, thyroid normal, and no anemia.    Eat a good variety of fruits and vegetables regularly, eat small potions of whole grains daily such as 1 cup per serving at a meal.  Eat lean small portion of meats 4-5oz portions.

## 2023-11-19 ENCOUNTER — Telehealth: Payer: Self-pay | Admitting: Internal Medicine

## 2023-11-19 NOTE — Telephone Encounter (Signed)
 Pt is scheduled in August for cpe but mom needs this form before July to keep child on her insurance saying he is disabled.   Placing form in green folder. Mother knows that you are out of office until June 23rd.

## 2024-01-10 ENCOUNTER — Encounter: Payer: Self-pay | Admitting: Medical

## 2024-01-10 ENCOUNTER — Ambulatory Visit (INDEPENDENT_AMBULATORY_CARE_PROVIDER_SITE_OTHER): Admitting: Medical

## 2024-01-10 VITALS — BP 108/68 | HR 85 | Ht 73.5 in | Wt 326.2 lb

## 2024-01-10 DIAGNOSIS — Z6841 Body Mass Index (BMI) 40.0 and over, adult: Secondary | ICD-10-CM | POA: Diagnosis not present

## 2024-01-10 DIAGNOSIS — Z13220 Encounter for screening for lipoid disorders: Secondary | ICD-10-CM

## 2024-01-10 DIAGNOSIS — Z79899 Other long term (current) drug therapy: Secondary | ICD-10-CM | POA: Diagnosis not present

## 2024-01-10 DIAGNOSIS — Z131 Encounter for screening for diabetes mellitus: Secondary | ICD-10-CM

## 2024-01-10 DIAGNOSIS — Z0000 Encounter for general adult medical examination without abnormal findings: Secondary | ICD-10-CM

## 2024-01-10 DIAGNOSIS — Z136 Encounter for screening for cardiovascular disorders: Secondary | ICD-10-CM

## 2024-01-10 LAB — LIPID PANEL

## 2024-01-10 NOTE — Addendum Note (Signed)
 Addended by: VICCI HUSBAND A on: 01/10/2024 03:33 PM   Modules accepted: Orders

## 2024-01-10 NOTE — Progress Notes (Signed)
Placed referral for dietician

## 2024-01-10 NOTE — Patient Instructions (Signed)
  Efforts to help lose weight: We are referring you to nutritionist I recommend eating 2- 3 meals daily I recommend eating 2-3 fruit servings daily I recommend eating vegetables throughout the day Limit meat servings to 4-5 ounces or less preferably Limit pasta, bread and rice to small serving size Recommend limiting dairy to 2% milk or small bits of cheese Avoid fried foods, high sugary foods, junk food and eating out regularly Trying to get exercise 4 to 5 days/week  See your dentist and eye doctor every year   See your psychiatrist as usual  Work on saving money every paycheck   Return yearly for physical with me

## 2024-01-10 NOTE — Progress Notes (Signed)
 Subjective:   HPI  Randall Laminack. is a 28 y.o. male who presents for Chief Complaint  Patient presents with   Annual Exam    Cpe, had something to eat 9-10 this morning. Discuss weight, eating a lot and not a lot of excerise-, needs paperwork about needing medical provider statement on letter.     Medical team Sees dentist Sees eye doctor Dr. Latricia Donath, psychiatry and Camelia Mountain, NP at [facility] care center No current counseling as prior counselors have come and gone. Randall Thompson, Alm RAMAN, PA-C here for primary care   Concerns: Here for well visit, accompanied by mother.   Mom concerned about his weight.   Working part times at [employer].  With his income he is order uber eats, eating late at night and snacking a lot.  Mom has told him she may have to put restrictions on his account to limit over eating.  The have worked with [employer] in the past to help with him bank account, handle money,but unfortunately he is just spending money on excess food and gaining a lot of weight.  Sees psychiatry quarterly.  Gets some exercise with walking.     Otherwise exercise is activity on the job.   No other complaint  Reviewed their medical, surgical, family, social, medication, and allergy history and updated chart as appropriate.  Past Medical History:  Diagnosis Date   ADHD (attention deficit hyperactivity disorder)    Allergy    Anxiety    Asperger's disorder    evaluation by psychiatry and neurology 2002-2008 (teach program, therapy, psychiatry)   Depression    sees a therapist and sees Dr. Von, [facility]   MDD (major depressive disorder), recurrent severe, without psychosis (HCC) 03/03/2015   Obesity    Sinusitis    Spleen laceration 2015   Suicide attempt (HCC)    several prior attempts as of 12/2015; hospitalization 10/2015 for suicide attempt   Wears glasses     Past Surgical History:  Procedure Laterality Date   FRENULECTOMY,  LINGUAL      Social History   Socioeconomic History   Marital status: Single    Spouse name: Not on file   Number of children: Not on file   Years of education: Not on file   Highest education level: Not on file  Occupational History   Not on file  Tobacco Use   Smoking status: Never   Smokeless tobacco: Never  Vaping Use   Vaping status: Never Used  Substance and Sexual Activity   Alcohol use: No   Drug use: No   Sexual activity: Never    Birth control/protection: Abstinence  Other Topics Concern   Not on file  Social History Narrative   Lives at home with mother and sister.    Works at [employer].   Went to [employer] high school.  Spends a lot of time playing video games.   Sees father some.   01/2024   Social Drivers of Corporate investment banker Strain: Not on file  Food Insecurity: Not on file  Transportation Needs: Not on file  Physical Activity: Not on file  Stress: Not on file  Social Connections: Not on file  Intimate Partner Violence: Not on file    Family History  Problem Relation Age of Onset   Hypertension Father    Depression Mother    Fibromyalgia Mother    Hypertension Mother    ADD / ADHD Sister  Depression Maternal Grandmother    Hypertension Maternal Grandmother    Hyperlipidemia Maternal Grandmother    Heart disease Maternal Grandmother        long QT   Hypertension Paternal Grandmother    Diabetes Paternal Grandmother    Hypertension Paternal Grandfather    Heart disease Paternal Grandfather      Current Outpatient Medications:    hydrOXYzine  (ATARAX /VISTARIL ) 25 MG tablet, TAKE 1 TWICE DAILY FOR ANXIETY/AGITATION, Disp: , Rfl: 1   ibuprofen  (ADVIL ,MOTRIN ) 200 MG tablet, Take 600 mg by mouth every 6 (six) hours as needed for headache or mild pain. Reported on 12/18/2015, Disp: , Rfl:    paliperidone (INVEGA) 9 MG 24 hr tablet, Take 9 mg by mouth daily., Disp: , Rfl:    traZODone (DESYREL) 50 MG tablet, Take 50 mg by mouth as  needed., Disp: , Rfl: 2  Allergies  Allergen Reactions   Latex Swelling    Review of Systems Constitutional: -fever, -chills, -sweats, -unexpected weight change, -decreased appetite, -fatigue Allergy: -sneezing, -itching, -congestion Dermatology: -changing moles, --rash, -lumps ENT: -runny nose, -ear pain, -sore throat, -hoarseness, -sinus pain, -teeth pain, - ringing in ears, -hearing loss, -nosebleeds Cardiology: -chest pain, -palpitations, -swelling, -difficulty breathing when lying flat, -waking up short of breath Respiratory: -cough, -shortness of breath, -difficulty breathing with exercise or exertion, -wheezing, -coughing up blood Gastroenterology: -abdominal pain, -nausea, -vomiting, -diarrhea, -constipation, -blood in stool, -changes in bowel movement, -difficulty swallowing or eating Hematology: -bleeding, -bruising  Musculoskeletal: -joint aches, -muscle aches, -joint swelling, -back pain, -neck pain, -cramping, -changes in gait Ophthalmology: denies vision changes, eye redness, itching, discharge Urology: -burning with urination, -difficulty urinating, -blood in urine, -urinary frequency, -urgency, -incontinence Neurology: -headache, -weakness, -tingling, -numbness, -memory loss, -falls, -dizziness Psychology: -depressed mood, -agitation, -sleep problems Male GU: no testicular mass, pain, no lymph nodes swollen, no swelling, no rash.     Objective:  BP 108/68   Pulse 85   Ht 6' 1.5 (1.867 m)   Wt (!) 326 lb 3.2 oz (148 kg)   SpO2 98%   BMI 42.45 kg/m   Wt Readings from Last 3 Encounters:  01/10/24 (!) 326 lb 3.2 oz (148 kg)  07/10/22 293 lb 6.4 oz (133.1 kg)  02/02/20 298 lb 12.8 oz (135.5 kg)    General appearance: alert, no distress, WD/WN, African American male Skin: no worrisome lesions, linear scar and some round small scars left anterior lower leg from prior trauma Neck: supple, no lymphadenopathy, no thyromegaly, no masses, normal ROM, no bruits Chest:  non tender, normal shape and expansion Heart: RRR, normal S1, S2, no murmurs Lungs: CTA bilaterally, no wheezes, rhonchi, or rales Abdomen: +bs, soft, non tender, non distended, no masses, no hepatomegaly, no splenomegaly, no bruits Back: non tender, normal ROM, no scoliosis Musculoskeletal: upper extremities non tender, no obvious deformity, normal ROM throughout, lower extremities non tender, no obvious deformity, normal ROM throughout Extremities: no edema, no cyanosis, no clubbing Pulses: 2+ symmetric, upper and lower extremities, normal cap refill Neurological: alert, oriented x 3, CN2-12 intact, strength normal upper extremities and lower extremities, sensation normal throughout, DTRs 2+ throughout, no cerebellar signs, gait normal Psychiatric: normal affect, behavior normal, pleasant  GU/rectal  -deferred   Assessment and Plan :   Encounter Diagnoses  Name Primary?   Encounter for health maintenance examination in adult Yes   Encounter for lipid screening for cardiovascular disease    Screening for diabetes mellitus    BMI 40.0-44.9, adult (HCC)    High  risk medication use      Physical exam - discussed and counseled on healthy lifestyle, diet, exercise, preventative care, vaccinations, sick and well care, proper use of emergency dept and after hours care, and addressed their concerns.    Health screening: See your eye doctor yearly for routine vision care. See your dentist yearly for routine dental care including hygiene visits twice yearly.  Cancer screening Advised monthly self testicular exam  Vaccinations: Advised yearly flu shot   Separate significant chronic issues discussed: Glad to hear he has a job and doing well  Mental health issues, autism spectrum disorder, depression-followed by psychiatry regularly  Obesity - counseling on diet, healthy choices, avoiding expensive uber eats.   Referral to nutritionist  I updated form for [employer] regarding  medical statement from 11/2023.  Deston was seen today for annual exam.  Diagnoses and all orders for this visit:  Encounter for health maintenance examination in adult -     CBC -     Comprehensive metabolic panel with GFR -     Lipid panel -     TSH -     Hemoglobin A1c  Encounter for lipid screening for cardiovascular disease -     Lipid panel  Screening for diabetes mellitus -     Hemoglobin A1c  BMI 40.0-44.9, adult (HCC) -     TSH  High risk medication use -     CBC -     TSH     Follow-up pending labs, yearly for physical

## 2024-01-11 ENCOUNTER — Other Ambulatory Visit: Payer: Self-pay | Admitting: Medical

## 2024-01-11 ENCOUNTER — Ambulatory Visit: Payer: Self-pay | Admitting: Medical

## 2024-01-11 LAB — COMPREHENSIVE METABOLIC PANEL WITH GFR
ALT: 52 IU/L — AB (ref 0–44)
AST: 40 IU/L (ref 0–40)
Albumin: 4.8 g/dL (ref 4.3–5.2)
Alkaline Phosphatase: 67 IU/L (ref 44–121)
BUN/Creatinine Ratio: 16 (ref 9–20)
BUN: 16 mg/dL (ref 6–20)
Bilirubin Total: 0.6 mg/dL (ref 0.0–1.2)
CO2: 23 mmol/L (ref 20–29)
Calcium: 9.5 mg/dL (ref 8.7–10.2)
Chloride: 100 mmol/L (ref 96–106)
Creatinine, Ser: 1.03 mg/dL (ref 0.76–1.27)
Globulin, Total: 2.6 g/dL (ref 1.5–4.5)
Glucose: 70 mg/dL (ref 70–99)
Potassium: 4.4 mmol/L (ref 3.5–5.2)
Sodium: 137 mmol/L (ref 134–144)
Total Protein: 7.4 g/dL (ref 6.0–8.5)
eGFR: 103 mL/min/1.73 (ref >59)

## 2024-01-11 LAB — HEMOGLOBIN A1C
Est. average glucose Bld gHb Est-mCnc: 100 mg/dL
Hgb A1c MFr Bld: 5.1 (ref 4.8–5.6)

## 2024-01-11 LAB — CBC
Hematocrit: 44.1 % (ref 37.5–51.0)
Hemoglobin: 13.8 g/dL (ref 13.0–17.7)
MCH: 23.9 pg — ABNORMAL LOW (ref 26.6–33.0)
MCHC: 31.3 g/dL — ABNORMAL LOW (ref 31.5–35.7)
MCV: 76 fL — ABNORMAL LOW (ref 79–97)
Platelets: 285 x10E3/uL (ref 150–450)
RBC: 5.77 x10E6/uL (ref 4.14–5.80)
RDW: 14.8 % (ref 11.6–15.4)
WBC: 5.8 x10E3/uL (ref 3.4–10.8)

## 2024-01-11 LAB — LIPID PANEL
Cholesterol, Total: 164 mg/dL (ref 100–199)
HDL: 36 mg/dL — AB (ref >39)
LDL CALC COMMENT:: 4.6 ratio (ref 0.0–5.0)
LDL Chol Calc (NIH): 103 mg/dL — AB (ref 0–99)
Triglycerides: 139 mg/dL (ref 0–149)
VLDL Cholesterol Cal: 25 mg/dL (ref 5–40)

## 2024-01-11 LAB — TSH: TSH: 0.333 u[IU]/mL — AB (ref 0.450–4.500)

## 2024-01-11 MED ORDER — MULTIVITAMIN ADULT PO TABS: 1.0000 | ORAL_TABLET | Freq: Every day | ORAL | 3 refills | Status: DC

## 2024-01-11 MED ORDER — FERROUS GLUCONATE 324 (38 FE) MG PO TABS
324.0000 mg | ORAL_TABLET | Freq: Every day | ORAL | 1 refills | Status: DC
Start: 2024-01-11 — End: 2024-02-29

## 2024-01-11 NOTE — Progress Notes (Signed)
 See if you can add a free T4 through the lab due to abnormal TSH  Just in case I did not tell yesterday I want to refer him to nutritionist  Please call mom and him about his lab results.  He may or may not understand everything fully due to autism spectrum disorder   Your kidney and electrolytes and blood sugar is okay.  1 liver test slightly elevated.  Your good cholesterol HDL is low.  Diabetes marker okay.  Thyroid test is abnormal so we need to do additional labs.  I will see if he can have these done.  Your blood counts are similar to prior, no anemia, but your indices are low suggesting possible iron deficiency.  Liver test could be related to fatty liver disease or medication.  I recommend making significant diet changes as we discussed.  Will continue plan to refer you to nutritionist  I recommend taking a daily multivitamin and iron supplement.  I will send these to the pharmacy

## 2024-01-12 ENCOUNTER — Encounter: Payer: Self-pay | Admitting: Medical

## 2024-01-12 DIAGNOSIS — R7989 Other specified abnormal findings of blood chemistry: Secondary | ICD-10-CM | POA: Insufficient documentation

## 2024-01-12 NOTE — Progress Notes (Signed)
 Free T4 was okay but the TSH hormone was abnormal.  Since it has been normal in the past we will just plan to recheck this again in a month  Schedule I month visit to discuss liver test, thyroid labs, progress with diet and food choices.

## 2024-01-13 LAB — SPECIMEN STATUS REPORT

## 2024-01-13 LAB — T4, FREE: Free T4: 1.16 ng/dL (ref 0.82–1.77)

## 2024-02-14 ENCOUNTER — Ambulatory Visit: Admitting: Medical

## 2024-02-16 ENCOUNTER — Encounter: Payer: Self-pay | Admitting: Skilled Nursing Facility1

## 2024-02-16 ENCOUNTER — Encounter: Attending: Medical | Admitting: Skilled Nursing Facility1

## 2024-02-16 VITALS — Ht 73.0 in | Wt 311.2 lb

## 2024-02-16 DIAGNOSIS — Z6841 Body Mass Index (BMI) 40.0 and over, adult: Secondary | ICD-10-CM | POA: Diagnosis not present

## 2024-02-16 DIAGNOSIS — Z713 Dietary counseling and surveillance: Secondary | ICD-10-CM | POA: Insufficient documentation

## 2024-02-16 DIAGNOSIS — E669 Obesity, unspecified: Secondary | ICD-10-CM | POA: Insufficient documentation

## 2024-02-16 NOTE — Progress Notes (Signed)
 Medical Nutrition Therapy  Appointment Start time:  1:54  Appointment End time:  3:00  Primary concerns today: weight loss  Referral diagnosis: e66.01   NUTRITION ASSESSMENT    Clinical Medical Hx:  Medications: see list Labs: A1C 5.1, TSH .333, HDL 36, LDL 103 Notable Signs/Symptoms: none reported   Lifestyle & Dietary Hx  Pt arrives with his mother.  Pt states he wants to manage his eating. Pt states he is not very active but he does pace for about 5 minutes when he thinks. Pt states he is very physical at work at [employer] and he is part time 4-5 hours per day day 3 days a week typically. Pt states he he lives with his mom and his sister. Pt states he plans on getting a treadmill.   Pt states he use dot eat large portions and eat out a lot but has cut back on those habits.  Pt states when he is hungry he eats fruit or vegetables throughout the day for example a can of pineapple.   Body Composition Scale 02/16/2024  Current Body Weight 311.2  Total Body Fat % 34.5  Visceral Fat 22  Fat-Free Mass % 65.4   Total Body Water % 46.4  Muscle-Mass lbs 58.7  BMI 40.9  Body Fat Displacement          Torso  lbs 66.8         Left Leg  lbs 13.3         Right Leg  lbs 13.3         Left Arm  lbs 6.6         Right Arm   lbs 6.6   Estimated daily fluid intake: oz Supplements:  Sleep:  Stress / self-care:  Current average weekly physical activity: ADL's, active job at [employer]  24-Hr Dietary Recall First Meal: bagel with cream cheese or pizza from [employer] or skipped Snack:  Second Meal: chicken ternderloins or fruit or skipped Snack: fruit or beef jerky or cheese or green apples  Third Meal: what mom cooks: rice, broccoli, chicken tenderloins Snack:  Beverages: water   NUTRITION INTERVENTION  Nutrition education (E-1) on the following topics:  Creation of balanced and diverse meals to increase the intake of nutrient-rich foods that provide essential vitamins, minerals, fiber,  and phytonutrients Variety of Fruits and Vegetables: Aim for a colorful array of fruits and vegetables to ensure a wide range of nutrients. Include a mix of leafy greens, berries, citrus fruits, cruciferous vegetables, and more. Whole Grains: Choose whole grains over refined grains. Examples include brown rice, quinoa, oats, whole wheat, and barley. Lean Proteins: Include lean sources of protein, such as poultry, fish, tofu, legumes, beans, lentils, and low-fat dairy products. Limit red and processed meats. Healthy Fats: Incorporate sources of healthy fats, including avocados, nuts, seeds, and olive oil. Limit saturated and trans fats found in fried and processed foods. Dairy or Dairy Alternatives: Choose low-fat or fat-free dairy products, or plant-based alternatives like almond or soy milk. Portion Control: Be mindful of portion sizes to avoid overeating. Pay attention to hunger and satisfaction cues. Limit Added Sugars: Minimize the consumption of sugary beverages, snacks, and desserts. Check food labels for added sugars and opt for natural sources of sweetness such as whole fruits. Hydration: Drink plenty of water throughout the day. Limit sugary drinks and excessive caffeine intake. Moderate Sodium Intake: Reduce the consumption of high-sodium foods. Use herbs and spices for flavor instead of excessive salt. Meal  Planning and Preparation: Plan and prepare meals ahead of time to make healthier choices more convenient. Include a mix of food groups in each meal. Limit Processed Foods: Minimize the intake of highly processed and packaged foods that are often high in added sugars, salt, and unhealthy fats. Regular Physical Activity: Combine a healthy diet with regular physical activity for overall well-being. Aim for at least 150 minutes of moderate-intensity aerobic exercise per week, along with strength training. Moderation and Balance: Enjoy treats and indulgent foods in  moderation, emphasizing balance rather than strict restriction.  Handouts Provided Include  Detailed MyPlate  Learning Style & Readiness for Change Teaching method utilized: Visual & Auditory  Demonstrated degree of understanding via: Teach Back  Barriers to learning/adherence to lifestyle change: not feeling the fullness cue   Goals Established by Pt  Walk on your treadmill on your off days from work aiming for 1 hour (working up to that hour 15 minutes at a time): Starting the first week you get your treadmill walk for 15 minutes, the second and third week walk for 30 minutes, the fourth and fifth week 45 minutes, and the sixth week hitting that hour   Use the portion sizes on the nutrition facts label   MONITORING & EVALUATION Dietary intake, weekly physical activity  Next Steps  Patient is to return in 6 weeks.

## 2024-02-17 ENCOUNTER — Ambulatory Visit: Admitting: Medical

## 2024-02-24 ENCOUNTER — Ambulatory Visit: Admitting: Medical

## 2024-02-29 ENCOUNTER — Ambulatory Visit (INDEPENDENT_AMBULATORY_CARE_PROVIDER_SITE_OTHER): Admitting: Medical

## 2024-02-29 VITALS — BP 120/76 | HR 76 | Ht 73.5 in | Wt 312.6 lb

## 2024-02-29 DIAGNOSIS — Z6841 Body Mass Index (BMI) 40.0 and over, adult: Secondary | ICD-10-CM | POA: Diagnosis not present

## 2024-02-29 DIAGNOSIS — R7989 Other specified abnormal findings of blood chemistry: Secondary | ICD-10-CM | POA: Diagnosis not present

## 2024-02-29 DIAGNOSIS — R718 Other abnormality of red blood cells: Secondary | ICD-10-CM | POA: Diagnosis not present

## 2024-02-29 MED ORDER — FERROUS GLUCONATE 324 (38 FE) MG PO TABS: 324.0000 mg | ORAL_TABLET | Freq: Every day | ORAL | 1 refills | Status: AC

## 2024-02-29 MED ORDER — MULTIVITAMIN ADULT PO TABS: 1.0000 | ORAL_TABLET | Freq: Every day | ORAL | 1 refills | Status: AC

## 2024-02-29 NOTE — Progress Notes (Signed)
 Subjective: Chief Complaint  Patient presents with   Medication Management   History of Present Illness Randall Thompson. is a 28 year old male who presents for follow-up of abnormal lab results from a previous well visit.  He was seen on January 10, 2024, for a well visit where several lab abnormalities were noted, including a slightly elevated liver test, low HDL cholesterol, abnormal thyroid lab, and low indices on blood counts.  Since the last visit, he has been exercising regularly and has managed to lose 14 pounds through portion control and substituting healthier food options like fruits. He drinks only water and avoids sugary drinks.  His thyroid function was initially abnormal, but a confirmation test showed normal results. This will be monitored periodically.  His low HDL cholesterol was noted.  The slightly elevated liver test was noted.   Past Medical History:  Diagnosis Date   ADHD (attention deficit hyperactivity disorder)    Allergy    Anxiety    Asperger's disorder    evaluation by psychiatry and neurology 2002-2008 (teach program, therapy, psychiatry)   Depression    sees a therapist and sees Dr. Von, [facility]   MDD (major depressive disorder), recurrent severe, without psychosis (HCC) 03/03/2015   Obesity    Sinusitis    Spleen laceration 2015   Suicide attempt (HCC)    several prior attempts as of 12/2015; hospitalization 10/2015 for suicide attempt   Wears glasses    Current Outpatient Medications on File Prior to Visit  Medication Sig Dispense Refill   hydrOXYzine  (ATARAX /VISTARIL ) 25 MG tablet TAKE 1 TWICE DAILY FOR ANXIETY/AGITATION  1   ibuprofen  (ADVIL ,MOTRIN ) 200 MG tablet Take 600 mg by mouth every 6 (six) hours as needed for headache or mild pain. Reported on 12/18/2015     paliperidone (INVEGA) 9 MG 24 hr tablet Take 9 mg by mouth daily.     traZODone (DESYREL) 50 MG tablet Take 50 mg by mouth as needed.  2   No current  facility-administered medications on file prior to visit.   ROS as in subjective     Physical Exam BP 120/76   Pulse 76   Ht 6' 1.5 (1.867 m)   Wt (!) 312 lb 9.6 oz (141.8 kg)   SpO2 96%   BMI 40.68 kg/m   Wt Readings from Last 3 Encounters:  02/29/24 (!) 312 lb 9.6 oz (141.8 kg)  02/16/24 (!) 311 lb 3.2 oz (141.2 kg)  01/10/24 (!) 326 lb 3.2 oz (148 kg)   Gen: wd, wn, nad    Assessment and Plan Encounter Diagnoses  Name Primary?   Elevated LFTs Yes   Abnormal RBC indices    BMI 40.0-44.9, adult (HCC)    Abnormal thyroid blood test     Elevated liver enzymes Slightly elevated liver enzymes, likely due to fatty liver and possible medication effects. Weight loss expected to improve liver function. - Encouraged continued weight loss and healthy low-fat diet. - Recheck liver enzymes in 3-4 months. - Consider liver ultrasound if liver enzymes remain elevated.  Overweight Overweight with successful weight loss of 14 pounds. Continued weight loss and healthy lifestyle changes recommended. - Encouraged continued weight loss through portion control and healthy food substitutions. - Advised regular exercise, preferably most days of the week. - Avoid sugary drinks such as sweet tea, soda, and energy drinks.  Low HDL cholesterol Low HDL cholesterol with expected improvement through regular exercise and consumption of healthy fats. - Encouraged regular exercise. -  Advised consumption of healthy fats such as nuts, fish, olive oil, avocado and similar  Low blood count indices (possible iron deficiency) Low blood count indices possibly due to iron deficiency. Iron supplementation and multivitamin recommended. Prescription issues noted, resend prescription for ferrous gluconate  and multivitamin. - Prescribed ferrous gluconate  Cena). - Prescribed multivitamin. -check with pharmacy today to verify if they have the prescript ready or not.  I refilled the medicaiton today.   Sometimes certain medications are available over the counter with insurance not paying for them.  [name] was seen today for medication management.  Diagnoses and all orders for this visit:  Elevated LFTs -     Hepatic Function Panel; Future  Abnormal RBC indices -     CBC with Differential/Platelet; Future  BMI 40.0-44.9, adult (HCC)  Abnormal thyroid blood test -     TSH + free T4; Future  Other orders -     Multiple Vitamin (MULTIVITAMIN ADULT) TABS; Take 1 tablet by mouth daily. -     ferrous gluconate  (FERGON) 324 MG tablet; Take 1 tablet (324 mg total) by mouth daily with breakfast.    - Recheck blood count in 3-4 months with nurse visit lab only.

## 2024-02-29 NOTE — Patient Instructions (Signed)
 Elevated liver enzymes Slightly elevated liver enzymes, likely due to fatty liver and possible medication effects. Weight loss expected to improve liver function. - Encouraged continued weight loss and healthy low-fat diet. - Recheck liver enzymes in 3-4 months. - Consider liver ultrasound if liver enzymes remain elevated.  Overweight Overweight with successful weight loss of 14 pounds. Continued weight loss and healthy lifestyle changes recommended. - Encouraged continued weight loss through portion control and healthy food substitutions. - Advised regular exercise, preferably most days of the week. - Avoid sugary drinks such as sweet tea, soda, and energy drinks.  Low HDL cholesterol Low HDL cholesterol with expected improvement through regular exercise and consumption of healthy fats. - Encouraged regular exercise. - Advised consumption of healthy fats such as nuts, fish, olive oil, avocado and similar  Low blood count indices (possible iron deficiency) Low blood count indices possibly due to iron deficiency. Iron supplementation and multivitamin recommended. Prescription issues noted, resend prescription for ferrous gluconate  and multivitamin. - Prescribed ferrous gluconate  Cena). - Prescribed multivitamin. -check with pharmacy today to verify if they have the prescript ready or not.  I refilled the medicaiton today.  Sometimes certain medications are available over the counter with insurance not paying for them.   - Recheck blood count in 3-4 months with nurse visit lab only.

## 2024-03-30 ENCOUNTER — Ambulatory Visit: Admitting: Skilled Nursing Facility1

## 2025-01-22 ENCOUNTER — Encounter: Payer: Self-pay | Admitting: Medical
